# Patient Record
Sex: Male | Born: 1975 | Race: Black or African American | Hispanic: No | Marital: Single | Smoking: Never smoker
Health system: Southern US, Community
[De-identification: ages and names within clinical notes are randomized; demographics above are authoritative.]

---

## 2011-06-15 ENCOUNTER — Emergency Department (HOSPITAL_COMMUNITY): Payer: Self-pay

## 2011-06-15 ENCOUNTER — Emergency Department (HOSPITAL_COMMUNITY)
Admission: EM | Admit: 2011-06-15 | Discharge: 2011-06-15 | Disposition: A | Discharge status: Home / Self Care | Payer: Self-pay | Attending: Emergency Medicine | Admitting: Emergency Medicine

## 2011-06-15 ENCOUNTER — Encounter (HOSPITAL_COMMUNITY): Payer: Self-pay

## 2011-06-15 DIAGNOSIS — M545 Low back pain, unspecified: Secondary | ICD-10-CM | POA: Insufficient documentation

## 2011-06-15 DIAGNOSIS — S39012A Strain of muscle, fascia and tendon of lower back, initial encounter: Secondary | ICD-10-CM

## 2011-06-15 MED ORDER — HYDROCODONE-ACETAMINOPHEN 5-325 MG PO TABS: 1.0000 | ORAL_TABLET | Freq: Four times a day (QID) | ORAL | Status: AC | PRN

## 2011-06-15 MED ORDER — CYCLOBENZAPRINE HCL 10 MG PO TABS: 10.0000 mg | ORAL_TABLET | Freq: Three times a day (TID) | ORAL | Status: AC | PRN

## 2011-06-15 MED ORDER — IBUPROFEN 800 MG PO TABS: 800.0000 mg | ORAL_TABLET | Freq: Three times a day (TID) | ORAL | Status: AC | PRN

## 2011-06-15 MED ORDER — HYDROCODONE-ACETAMINOPHEN 5-325 MG PO TABS
1.0000 | ORAL_TABLET | Freq: Once | ORAL | Status: AC
  Administered 2011-06-15: 1 via ORAL
  Filled 2011-06-15: qty 1
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
## 2011-06-15 NOTE — Discharge Instructions (Signed)
Your x-rays were normal here tonight.  Return here for any worsening in her condition.  Use ice and heat on your back

## 2011-06-15 NOTE — ED Provider Notes (Signed)
Medical screening examination/treatment/procedure(s) were performed by non-physician practitioner and as supervising physician I was immediately available for consultation/collaboration.   Harinder Romas, MD 06/15/11 2227 
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
## 2015-05-16 ENCOUNTER — Ambulatory Visit (HOSPITAL_COMMUNITY)
Admission: EM | Admit: 2015-05-16 | Discharge: 2015-05-16 | Disposition: A | Discharge status: Home / Self Care | Payer: No Typology Code available for payment source | Attending: Family Medicine | Admitting: Family Medicine

## 2015-05-16 ENCOUNTER — Encounter (HOSPITAL_COMMUNITY): Payer: Self-pay | Admitting: *Deleted

## 2015-05-16 DIAGNOSIS — M542 Cervicalgia: Secondary | ICD-10-CM

## 2015-05-16 DIAGNOSIS — Z041 Encounter for examination and observation following transport accident: Secondary | ICD-10-CM

## 2015-05-16 DIAGNOSIS — M549 Dorsalgia, unspecified: Secondary | ICD-10-CM

## 2015-05-16 MED ORDER — CYCLOBENZAPRINE HCL 5 MG PO TABS: 5.0000 mg | ORAL_TABLET | Freq: Three times a day (TID) | ORAL | Status: DC | PRN

## 2015-05-16 MED ORDER — IBUPROFEN 800 MG PO TABS: 800.0000 mg | ORAL_TABLET | Freq: Three times a day (TID) | ORAL | Status: AC
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
## 2021-09-22 ENCOUNTER — Ambulatory Visit (INDEPENDENT_AMBULATORY_CARE_PROVIDER_SITE_OTHER): Payer: Self-pay

## 2021-09-22 ENCOUNTER — Encounter: Payer: Self-pay | Admitting: Emergency Medicine

## 2021-09-22 ENCOUNTER — Ambulatory Visit
Admission: EM | Admit: 2021-09-22 | Discharge: 2021-09-22 | Disposition: A | Discharge status: Home / Self Care | Payer: Self-pay | Attending: Urgent Care | Admitting: Urgent Care

## 2021-09-22 ENCOUNTER — Ambulatory Visit: Payer: Self-pay

## 2021-09-22 DIAGNOSIS — M542 Cervicalgia: Secondary | ICD-10-CM

## 2021-09-22 DIAGNOSIS — M545 Low back pain, unspecified: Secondary | ICD-10-CM

## 2021-09-22 MED ORDER — NAPROXEN 500 MG PO TABS: 500.0000 mg | ORAL_TABLET | Freq: Two times a day (BID) | ORAL | 0 refills | Status: AC

## 2021-09-22 MED ORDER — TIZANIDINE HCL 4 MG PO TABS: 4.0000 mg | ORAL_TABLET | Freq: Every day | ORAL | 0 refills | Status: AC
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
## 2022-12-27 ENCOUNTER — Encounter (HOSPITAL_COMMUNITY): Payer: Self-pay | Admitting: Emergency Medicine

## 2022-12-27 ENCOUNTER — Ambulatory Visit (HOSPITAL_COMMUNITY)
Admission: EM | Admit: 2022-12-27 | Discharge: 2022-12-27 | Disposition: A | Discharge status: Home / Self Care | Payer: Self-pay | Attending: Internal Medicine | Admitting: Internal Medicine

## 2022-12-27 DIAGNOSIS — S39012A Strain of muscle, fascia and tendon of lower back, initial encounter: Secondary | ICD-10-CM

## 2022-12-27 MED ORDER — CYCLOBENZAPRINE HCL 10 MG PO TABS: 10.0000 mg | ORAL_TABLET | Freq: Three times a day (TID) | ORAL | 0 refills | Status: AC | PRN

## 2022-12-27 MED ORDER — PREDNISONE 10 MG PO TABS: 40.0000 mg | ORAL_TABLET | Freq: Every day | ORAL | 0 refills | Status: AC

## 2022-12-27 MED ORDER — KETOROLAC TROMETHAMINE 30 MG/ML IJ SOLN
INTRAMUSCULAR | Status: AC
  Filled 2022-12-27: qty 1

## 2022-12-27 MED ORDER — KETOROLAC TROMETHAMINE 30 MG/ML IJ SOLN
30.0000 mg | Freq: Once | INTRAMUSCULAR | Status: AC
  Administered 2022-12-27: 30 mg via INTRAMUSCULAR
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
